# Patient Record
Sex: Male | Born: 1990 | Race: Black or African American | Hispanic: No | Marital: Single | Smoking: Former smoker
Health system: Southern US, Community
[De-identification: ages and names within clinical notes are randomized; demographics above are authoritative.]

---
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
## 2009-05-31 ENCOUNTER — Emergency Department (HOSPITAL_COMMUNITY): Admission: EM | Admit: 2009-05-31 | Discharge: 2009-05-31 | Payer: Self-pay | Admitting: Emergency Medicine

## 2010-05-16 LAB — RAPID STREP SCREEN (MED CTR MEBANE ONLY): Streptococcus, Group A Screen (Direct): NEGATIVE

## 2016-12-28 ENCOUNTER — Emergency Department (HOSPITAL_COMMUNITY)
Admission: EM | Admit: 2016-12-28 | Discharge: 2016-12-28 | Disposition: A | Discharge status: Home / Self Care | Payer: No Typology Code available for payment source | Attending: Emergency Medicine | Admitting: Emergency Medicine

## 2016-12-28 ENCOUNTER — Emergency Department (HOSPITAL_COMMUNITY): Payer: No Typology Code available for payment source

## 2016-12-28 ENCOUNTER — Encounter (HOSPITAL_COMMUNITY): Payer: Self-pay | Admitting: Emergency Medicine

## 2016-12-28 DIAGNOSIS — R51 Headache: Secondary | ICD-10-CM | POA: Insufficient documentation

## 2016-12-28 DIAGNOSIS — Z041 Encounter for examination and observation following transport accident: Secondary | ICD-10-CM | POA: Diagnosis not present

## 2016-12-28 DIAGNOSIS — F17290 Nicotine dependence, other tobacco product, uncomplicated: Secondary | ICD-10-CM | POA: Insufficient documentation

## 2016-12-28 DIAGNOSIS — M546 Pain in thoracic spine: Secondary | ICD-10-CM | POA: Diagnosis not present

## 2016-12-28 DIAGNOSIS — M25561 Pain in right knee: Secondary | ICD-10-CM | POA: Diagnosis not present

## 2016-12-28 MED ORDER — IBUPROFEN 200 MG PO TABS
600.0000 mg | ORAL_TABLET | Freq: Once | ORAL | Status: AC
  Administered 2016-12-28: 600 mg via ORAL
  Filled 2016-12-28: qty 1

## 2016-12-28 MED ORDER — CYCLOBENZAPRINE HCL 10 MG PO TABS: 10.0000 mg | ORAL_TABLET | Freq: Two times a day (BID) | ORAL | 0 refills | Status: DC | PRN

## 2016-12-28 NOTE — Discharge Instructions (Signed)
The x-ray of your right knee and spine do not show fracture.   I have written you a prescription for a muscle relaxer called Flexeril.  Remember that this medicine can make you drowsy, please do not drive, work or drink alcohol while taking this medicine.  You can take 600 mg ibuprofen every 6 hours as needed for knee and back pain.  Return to the ER if you develop a worsening headache with vomiting that will not stop, headache with numbness in which you cannot feel your hands or feet, headache with double vision that does not go away. Also return to the ER if you have any new or worsening symptoms.

## 2016-12-28 NOTE — ED Triage Notes (Signed)
Pt. Has a leg splint to rt. Leg.
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
## 2016-12-28 NOTE — ED Notes (Signed)
Declined W/C at D/C and was escorted to lobby by RN. 
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
## 2017-01-06 ENCOUNTER — Encounter (HOSPITAL_COMMUNITY): Payer: Self-pay | Admitting: Emergency Medicine

## 2017-01-06 ENCOUNTER — Other Ambulatory Visit: Payer: Self-pay

## 2017-01-06 ENCOUNTER — Emergency Department (HOSPITAL_COMMUNITY)
Admission: EM | Admit: 2017-01-06 | Discharge: 2017-01-06 | Disposition: A | Discharge status: Home / Self Care | Payer: No Typology Code available for payment source | Attending: Emergency Medicine | Admitting: Emergency Medicine

## 2017-01-06 DIAGNOSIS — Y998 Other external cause status: Secondary | ICD-10-CM | POA: Insufficient documentation

## 2017-01-06 DIAGNOSIS — Y92410 Unspecified street and highway as the place of occurrence of the external cause: Secondary | ICD-10-CM | POA: Insufficient documentation

## 2017-01-06 DIAGNOSIS — M79651 Pain in right thigh: Secondary | ICD-10-CM

## 2017-01-06 DIAGNOSIS — M545 Low back pain, unspecified: Secondary | ICD-10-CM

## 2017-01-06 DIAGNOSIS — F17290 Nicotine dependence, other tobacco product, uncomplicated: Secondary | ICD-10-CM | POA: Diagnosis not present

## 2017-01-06 DIAGNOSIS — M25561 Pain in right knee: Secondary | ICD-10-CM | POA: Diagnosis not present

## 2017-01-06 DIAGNOSIS — Y9389 Activity, other specified: Secondary | ICD-10-CM | POA: Diagnosis not present

## 2017-01-06 MED ORDER — KETOROLAC TROMETHAMINE 30 MG/ML IJ SOLN
30.0000 mg | Freq: Once | INTRAMUSCULAR | Status: AC
  Administered 2017-01-06: 30 mg via INTRAMUSCULAR
  Filled 2017-01-06: qty 1

## 2017-01-06 MED ORDER — CYCLOBENZAPRINE HCL 10 MG PO TABS: 10.0000 mg | ORAL_TABLET | Freq: Two times a day (BID) | ORAL | 0 refills | Status: DC | PRN

## 2017-01-06 MED ORDER — NAPROXEN 500 MG PO TABS: 500.0000 mg | ORAL_TABLET | Freq: Two times a day (BID) | ORAL | 0 refills | Status: DC
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
## 2017-01-06 NOTE — ED Notes (Signed)
Pt ambulated without assistance with no difficulties.
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
## 2017-01-06 NOTE — Discharge Instructions (Signed)
Naprosyn and Flexeril for pain, please do not come by and with any other pain reliever aside from Tylenol.  These use the next few days to rest, keep knee and leg elevated, may use alternating ice and heat.  Try to avoid vigorous activity or forward bending.  If symptoms are still not improving please follow-up with [facility], please follow-up with your primary care doctor as well.  If you have significantly worsening pain, redness, warmth or swelling of knee or thigh, or you develop weakness, numbness difficulty walking or controlling bowel or bladder please return to the ED for sooner evaluation.

## 2018-03-31 ENCOUNTER — Encounter (HOSPITAL_COMMUNITY): Payer: Self-pay

## 2018-03-31 ENCOUNTER — Ambulatory Visit (HOSPITAL_COMMUNITY)
Admission: EM | Admit: 2018-03-31 | Discharge: 2018-03-31 | Disposition: A | Discharge status: Home / Self Care | Payer: PRIVATE HEALTH INSURANCE | Attending: Family Medicine | Admitting: Family Medicine

## 2018-03-31 DIAGNOSIS — H5712 Ocular pain, left eye: Secondary | ICD-10-CM

## 2018-03-31 MED ORDER — FLUORESCEIN SODIUM 1 MG OP STRP
ORAL_STRIP | OPHTHALMIC | Status: AC
  Filled 2018-03-31: qty 1

## 2018-03-31 MED ORDER — TETRACAINE HCL 0.5 % OP SOLN
OPHTHALMIC | Status: AC
  Filled 2018-03-31: qty 4

## 2018-03-31 NOTE — ED Triage Notes (Signed)
Pt presents with complaints of pain in left eye since this morning. Denies any visual changes.

## 2018-03-31 NOTE — ED Provider Notes (Signed)
[facility]    CSN: [id number] Arrival date & time: 03/31/18  1240     History   Chief Complaint Chief Complaint  Patient presents with  . Eye Pain    HPI Alex Richards is a 28 y.o. male.   HPI patient reports L eye pain upon waking this morning, with pain mostly to the outer aspect of the eye. He denies any known injury or trauma, no drainage or excessive watering and is unaware of any events of foreign bodies entering the eye. Patient reports pain is worse with movement and opening of the eye  , however he denies any changes in his vision. Reports using some otc eye drops at home which caused his eye to feel like it was burning. Denies any other related symptoms such as nasal drainage, cough, sinus pressure or headache.   History reviewed. No pertinent past medical history.  There are no active problems to display for this patient.   History reviewed. No pertinent surgical history.     Home Medications    Prior to Admission medications   Not on File    Family History Family History  Problem Relation Age of Onset  . Diabetes Mother   . Hypertension Mother   . Seizures Mother   . Cancer Mother   . Osteoarthritis Mother   . Healthy Father     Mother - glaucoma and cataracts  Social History Social History   Tobacco Use  . Smoking status: Current Every Day Smoker    Types: E-cigarettes  . Smokeless tobacco: Current User  Substance Use Topics  . Alcohol use: No  . Drug use: No     Allergies   Patient has no known allergies.   Review of Systems Review of Systems  Constitutional: Negative for chills and fever.  HENT: Negative for ear pain and sore throat.   Eyes: Positive for pain and redness. Negative for visual disturbance.  Respiratory: Negative for cough and shortness of breath.   Cardiovascular: Negative for chest pain and palpitations.  Gastrointestinal: Negative for abdominal pain and vomiting.  Genitourinary: Negative for dysuria  and hematuria.  Musculoskeletal: Negative for arthralgias and back pain.  Skin: Negative for color change and rash.  Neurological: Negative for seizures and syncope.  All other systems reviewed and are negative.    Physical Exam Triage Vital Signs ED Triage Vitals [03/31/18 1413]  Enc Vitals Group     BP 119/73     Pulse Rate 62     Resp 18     Temp 98.2 F (36.8 C)     Temp src      SpO2 100 %     Weight      Height      Head Circumference      Peak Flow      Pain Score 6     Pain Loc      Pain Edu?      Excl. in GC?    No data found.  Updated Vital Signs BP 119/73   Pulse 62   Temp 98.2 F (36.8 C)   Resp 18   SpO2 100%       Physical Exam Constitutional:      General: He is in acute distress.     Appearance: He is well-developed.  HENT:     Head: Normocephalic and atraumatic.     Nose: Nose normal.     Mouth/Throat:     Mouth: Mucous membranes are moist.  Eyes:     General: Lids are normal. Lids are everted, no foreign bodies appreciated. Vision grossly intact. Gaze aligned appropriately.        Right eye: No foreign body, discharge or hordeolum.        Left eye: No foreign body or discharge.     Intraocular pressure: Right eye pressure is 21 mmHg.     Extraocular Movements: Extraocular movements intact.     Conjunctiva/sclera:     Right eye: Right conjunctiva is injected.     Pupils: Pupils are equal, round, and reactive to light.     Right eye: Pupil is round and reactive. No fluorescein uptake.  Neck:     Musculoskeletal: Normal range of motion.  Cardiovascular:     Rate and Rhythm: Normal rate.  Pulmonary:     Effort: Pulmonary effort is normal. No respiratory distress.  Abdominal:     General: There is no distension.     Palpations: Abdomen is soft.  Musculoskeletal: Normal range of motion.  Skin:    General: Skin is warm and dry.  Neurological:     Mental Status: He is alert.      UC Treatments / Results  Labs (all labs ordered  are listed, but only abnormal results are displayed) Labs Reviewed - No data to display  EKG None  Radiology No results found.  Procedures Procedures (including critical care time)  Medications Ordered in UC Medications - No data to display  Initial Impression / Assessment and Plan / UC Course  I have reviewed the triage vital signs and the nursing notes.  Pertinent labs & imaging results that were available during my care of the patient were reviewed by me and considered in my medical decision making (see chart for details).    [facility] to see paitent right now  Final Clinical Impressions(s) / UC Diagnoses   Final diagnoses:  Left eye pain     Discharge Instructions     Go to Yates Decamprew Mincey, MD [facility] [address] [facility] [phone]   ED Prescriptions    None     Controlled Substance Prescriptions [place] Controlled Substance Registry consulted? Not Applicable   Eustace MooreNelson, Yvonne Sue, MD 03/31/18 [phone]

## 2018-03-31 NOTE — Discharge Instructions (Signed)
Go to Yates Decamp, MD [facility] [address] [facility] [phone]

## 2018-03-31 NOTE — ED Notes (Signed)
Pt unable to do visual acuity, states it hurts too bad for him to hold his eyes open.

## 2019-01-04 ENCOUNTER — Emergency Department (HOSPITAL_COMMUNITY)
Admission: EM | Admit: 2019-01-04 | Discharge: 2019-01-04 | Disposition: A | Discharge status: Home / Self Care | Payer: Worker's Compensation | Attending: Emergency Medicine | Admitting: Emergency Medicine

## 2019-01-04 ENCOUNTER — Emergency Department (HOSPITAL_COMMUNITY): Payer: Worker's Compensation

## 2019-01-04 ENCOUNTER — Other Ambulatory Visit: Payer: Self-pay

## 2019-01-04 ENCOUNTER — Emergency Department (HOSPITAL_COMMUNITY)
Admission: EM | Admit: 2019-01-04 | Discharge: 2019-01-04 | Discharge status: Against Medical Advice | Payer: PRIVATE HEALTH INSURANCE

## 2019-01-04 ENCOUNTER — Encounter (HOSPITAL_COMMUNITY): Payer: Self-pay

## 2019-01-04 DIAGNOSIS — Y939 Activity, unspecified: Secondary | ICD-10-CM | POA: Insufficient documentation

## 2019-01-04 DIAGNOSIS — S90122A Contusion of left lesser toe(s) without damage to nail, initial encounter: Secondary | ICD-10-CM | POA: Diagnosis not present

## 2019-01-04 DIAGNOSIS — F17290 Nicotine dependence, other tobacco product, uncomplicated: Secondary | ICD-10-CM | POA: Diagnosis not present

## 2019-01-04 DIAGNOSIS — S9032XA Contusion of left foot, initial encounter: Secondary | ICD-10-CM

## 2019-01-04 DIAGNOSIS — Y9289 Other specified places as the place of occurrence of the external cause: Secondary | ICD-10-CM | POA: Insufficient documentation

## 2019-01-04 DIAGNOSIS — W208XXA Other cause of strike by thrown, projected or falling object, initial encounter: Secondary | ICD-10-CM | POA: Diagnosis not present

## 2019-01-04 DIAGNOSIS — Y990 Civilian activity done for income or pay: Secondary | ICD-10-CM | POA: Insufficient documentation

## 2019-01-04 DIAGNOSIS — Z23 Encounter for immunization: Secondary | ICD-10-CM | POA: Insufficient documentation

## 2019-01-04 DIAGNOSIS — S99922A Unspecified injury of left foot, initial encounter: Secondary | ICD-10-CM | POA: Diagnosis present

## 2019-01-04 MED ORDER — IBUPROFEN 200 MG PO TABS
600.0000 mg | ORAL_TABLET | Freq: Once | ORAL | Status: AC
  Administered 2019-01-04: 600 mg via ORAL
  Filled 2019-01-04: qty 1

## 2019-01-04 MED ORDER — IBUPROFEN 600 MG PO TABS: 600.0000 mg | ORAL_TABLET | Freq: Four times a day (QID) | ORAL | 0 refills | Status: DC | PRN

## 2019-01-04 MED ORDER — CEPHALEXIN 500 MG PO CAPS: 500.0000 mg | ORAL_CAPSULE | Freq: Four times a day (QID) | ORAL | 0 refills | Status: DC

## 2019-01-04 MED ORDER — ACETAMINOPHEN 500 MG PO TABS
1000.0000 mg | ORAL_TABLET | Freq: Three times a day (TID) | ORAL | 0 refills | Status: AC | PRN
Start: 2019-01-04 — End: ?

## 2019-01-04 MED ORDER — TETANUS-DIPHTH-ACELL PERTUSSIS 5-2.5-18.5 LF-MCG/0.5 IM SUSP
0.5000 mL | Freq: Once | INTRAMUSCULAR | Status: AC
  Administered 2019-01-04: 0.5 mL via INTRAMUSCULAR
  Filled 2019-01-04: qty 0.5

## 2019-01-04 NOTE — ED Notes (Signed)
Patient given work note for request of light duty or out of work til nov 14th per [place] Peaks PA's verbal order.

## 2019-01-04 NOTE — ED Notes (Signed)
ED Provider at bedside. 

## 2019-01-04 NOTE — ED Provider Notes (Signed)
[facility] EMERGENCY DEPARTMENT Provider Note   CSN: [id number] Arrival date & time: 01/04/19  5916     History   Chief Complaint No chief complaint on file.   HPI Alex Richards is a 28 y.o. male who is previously healthy who presents with left foot injury after something fell on his foot yesterday at work.  He was wearing sneakers.  He reports he washed the toe at home.  He has been able to bear weight.  Patient took Tylenol at home with some relief of the pain.  He denies any other injuries.  Tetanus is not up-to-date     HPI  History reviewed. No pertinent past medical history.  There are no active problems to display for this patient.   History reviewed. No pertinent surgical history.      Home Medications    Prior to Admission medications   Medication Sig Start Date End Date Taking? Authorizing Provider  acetaminophen (TYLENOL) 500 MG tablet Take 2 tablets (1,000 mg total) by mouth every 8 (eight) hours as needed for moderate pain. 01/04/19   Lauriana Denes, Waylan Boga, PA-C  cephALEXin (KEFLEX) 500 MG capsule Take 1 capsule (500 mg total) by mouth 4 (four) times daily. 01/04/19   Oza Oberle, Waylan Boga, PA-C  ibuprofen (ADVIL) 600 MG tablet Take 1 tablet (600 mg total) by mouth every 6 (six) hours as needed. 01/04/19   Emi Holes, PA-C    Family History Family History  Problem Relation Age of Onset   Diabetes Mother    Hypertension Mother    Seizures Mother    Cancer Mother    Osteoarthritis Mother    Healthy Father     Social History Social History   Tobacco Use   Smoking status: Current Every Day Smoker    Types: E-cigarettes   Smokeless tobacco: Current User  Substance Use Topics   Alcohol use: No   Drug use: No     Allergies   Patient has no known allergies.   Review of Systems Review of Systems  Musculoskeletal: Positive for arthralgias.  Skin: Positive for wound.     Physical Exam Updated Vital Signs BP 125/69  (BP Location: Right Arm)    Pulse 79    Temp 98.4 F (36.9 C) (Oral)    Resp 16    SpO2 93%   Physical Exam Vitals signs and nursing note reviewed.  Constitutional:      General: He is not in acute distress.    Appearance: He is well-developed. He is not diaphoretic.  HENT:     Head: Normocephalic and atraumatic.     Mouth/Throat:     Pharynx: No oropharyngeal exudate.  Eyes:     General: No scleral icterus.       Right eye: No discharge.        Left eye: No discharge.     Conjunctiva/sclera: Conjunctivae normal.     Pupils: Pupils are equal, round, and reactive to light.  Neck:     Musculoskeletal: Normal range of motion and neck supple.     Thyroid: No thyromegaly.  Cardiovascular:     Rate and Rhythm: Normal rate and regular rhythm.     Heart sounds: Normal heart sounds. No murmur. No friction rub. No gallop.   Pulmonary:     Effort: Pulmonary effort is normal. No respiratory distress.     Breath sounds: Normal breath sounds. No stridor. No wheezing or rales.  Abdominal:     General:  Bowel sounds are normal. There is no distension.     Palpations: Abdomen is soft.     Tenderness: There is no abdominal tenderness. There is no guarding or rebound.  Musculoskeletal:       Feet:     Comments: No significant edema, deformity, or ecchymosis; however tenderness to the first through third digits of the left foot  Lymphadenopathy:     Cervical: No cervical adenopathy.  Skin:    General: Skin is warm and dry.     Coloration: Skin is not pale.     Findings: No rash.  Neurological:     Mental Status: He is alert.     Coordination: Coordination normal.      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 EXAM: LEFT FOOT - COMPLETE 3+ VIEW  COMPARISON:  None. FINDINGS: There is no evidence of fracture or dislocation. There is no evidence of arthropathy or other focal bone abnormality. Soft tissues are unremarkable. IMPRESSION: Negative. Electronically Signed   By: Marijo Conception M.D.   On: 01/04/2019 10:18    Procedures Procedures (including critical care time)  Medications Ordered in ED Medications  Tdap (BOOSTRIX) injection 0.5 mL (has no administration in time range)  ibuprofen (ADVIL) tablet 600 mg (has no administration in time range)     Initial Impression / Assessment and Plan / ED Course  I have reviewed the triage vital signs and the nursing notes.  Pertinent labs & imaging results that were available during my care of the patient were reviewed by me and considered in my medical decision making (see chart for details).        Patient with foot contusion and very superficial wound to the left great toe.  No indication for repair.  Considering location, will cover with Keflex for prophylaxis.  X-rays negative.  Tetanus updated.  Ibuprofen and Tylenol discussed for pain.  Postop shoe given for comfort.  Will refer to podiatry as needed if symptoms not improving.  Patient made no wear of occult fracture.  X-ray reviewed by me as well.  Patient understands and agrees with plan.  Patient vital stable throughout ED course and discharged in satisfactory condition.  Final Clinical Impressions(s) / ED Diagnoses   Final diagnoses:  Contusion of left foot including toes, initial encounter    ED Discharge Orders         Ordered    cephALEXin (KEFLEX) 500 MG capsule  4 times daily     01/04/19 1044    ibuprofen (ADVIL) 600 MG tablet  Every 6 hours PRN     01/04/19 1044    acetaminophen (TYLENOL) 500 MG tablet  Every 8 hours PRN     01/04/19 1044           Nonna Renninger, [place], PA-C 01/04/19 1045    Virgel Manifold, MD 01/04/19 1113

## 2019-01-04 NOTE — ED Triage Notes (Signed)
Patient complains of pain to left great toe after hitting same with forklift last night, arrived with dressing to same and bleeding controlled

## 2019-01-04 NOTE — ED Notes (Signed)
Patient verbalized understanding of dc instructions, vss, ambulatory with nad.   

## 2019-01-04 NOTE — ED Notes (Signed)
Pt did not respond when called for triage 

## 2019-01-04 NOTE — Discharge Instructions (Signed)
Take Keflex as prescribed to help prevent infection.  Apply antibiotic ointment to your wound daily after washing with warm soapy water.  Take ibuprofen and Tylenol as prescribed, as needed for your pain.  Use ice 3-4 times daily alternating 20 minutes on, 20 minutes off.  Elevate your foot whenever you are not walking on it.  Use postop shoe for comfort.  You can follow-up with Dr. Amalia Hailey, podiatrist, if symptoms are not improving over the next week.  Please return the emergency department if you develop increasing pain, redness, swelling, drainage, red streaking from the wound.

## 2020-03-06 ENCOUNTER — Ambulatory Visit
Admission: EM | Admit: 2020-03-06 | Discharge: 2020-03-06 | Disposition: A | Discharge status: Home / Self Care | Payer: Commercial Managed Care - PPO | Attending: Internal Medicine | Admitting: Internal Medicine

## 2020-03-06 ENCOUNTER — Encounter: Payer: Self-pay | Admitting: Emergency Medicine

## 2020-03-06 ENCOUNTER — Other Ambulatory Visit: Payer: Self-pay

## 2020-03-06 DIAGNOSIS — R112 Nausea with vomiting, unspecified: Secondary | ICD-10-CM

## 2020-03-06 DIAGNOSIS — R509 Fever, unspecified: Secondary | ICD-10-CM

## 2020-03-06 DIAGNOSIS — M791 Myalgia, unspecified site: Secondary | ICD-10-CM | POA: Diagnosis not present

## 2020-03-06 DIAGNOSIS — R6889 Other general symptoms and signs: Secondary | ICD-10-CM | POA: Diagnosis not present

## 2020-03-06 MED ORDER — ONDANSETRON 4 MG PO TBDP: 4.0000 mg | ORAL_TABLET | Freq: Three times a day (TID) | ORAL | 0 refills | Status: DC | PRN

## 2020-03-06 MED ORDER — IBUPROFEN 600 MG PO TABS: 600.0000 mg | ORAL_TABLET | Freq: Four times a day (QID) | ORAL | 0 refills | Status: AC | PRN

## 2020-03-06 NOTE — Discharge Instructions (Signed)
Increase oral fluid intake Please quarantine until COVID-19 test results are available Ibuprofen as needed for fever and/for generalized body aches We will call you with recommendations if labs are abnormal Return to urgent care if symptoms worsen. 

## 2020-03-06 NOTE — ED Provider Notes (Signed)
[facility]    CSN: [id number] Arrival date & time: 03/06/20  0810      History   Chief Complaint Chief Complaint  Patient presents with  . Emesis  . Headache    HPI Alex Richards is a 30 y.o. male comes to urgent care with a 1 day history of throbbing headache, vomiting and some abdominal discomfort.  Patient has subjective fever and chills.  No diarrhea.  No recent travel or unusual diet.  Patient is vaccinated against COVID-19 virus.  He is going to get his booster shot next week.  No dizziness, near syncope or syncopal episode.  No sick contacts.  Patient has had 4 episodes of nonbloody nonbilious vomiting.   HPI  History reviewed. No pertinent past medical history.  There are no problems to display for this patient.   History reviewed. No pertinent surgical history.     Home Medications    Prior to Admission medications   Medication Sig Start Date End Date Taking? Authorizing Provider  ibuprofen (ADVIL) 600 MG tablet Take 1 tablet (600 mg total) by mouth every 6 (six) hours as needed. 03/06/20  Yes Romie Tay, Britta Mccreedy, MD  ondansetron (ZOFRAN ODT) 4 MG disintegrating tablet Take 1 tablet (4 mg total) by mouth every 8 (eight) hours as needed for nausea or vomiting. 03/06/20  Yes Rachit Grim, Britta Mccreedy, MD  acetaminophen (TYLENOL) 500 MG tablet Take 2 tablets (1,000 mg total) by mouth every 8 (eight) hours as needed for moderate pain. 01/04/19   Law, Waylan Boga, PA-C  cephALEXin (KEFLEX) 500 MG capsule Take 1 capsule (500 mg total) by mouth 4 (four) times daily. 01/04/19   Emi Holes, PA-C    Family History Family History  Problem Relation Age of Onset  . Diabetes Mother   . Hypertension Mother   . Seizures Mother   . Cancer Mother   . Osteoarthritis Mother   . Healthy Father     Social History Social History   Tobacco Use  . Smoking status: Current Every Day Smoker    Types: E-cigarettes  . Smokeless tobacco: Current User  Substance Use  Topics  . Alcohol use: No  . Drug use: No     Allergies   Patient has no known allergies.   Review of Systems Review of Systems  Constitutional: Positive for chills and fever.  HENT: Negative.  Negative for sore throat.   Respiratory: Negative for cough.   Gastrointestinal: Positive for abdominal pain, nausea and vomiting. Negative for diarrhea.  Musculoskeletal: Positive for myalgias. Negative for joint swelling.  Neurological: Positive for dizziness.     Physical Exam Triage Vital Signs ED Triage Vitals  Enc Vitals Group     BP 03/06/20 0822 (!) 144/86     Pulse Rate 03/06/20 0822 73     Resp 03/06/20 0822 18     Temp 03/06/20 0822 98.4 F (36.9 C)     Temp Source 03/06/20 0822 Oral     SpO2 03/06/20 0822 97 %     Weight --      Height --      Head Circumference --      Peak Flow --      Pain Score 03/06/20 0834 4     Pain Loc --      Pain Edu? --      Excl. in GC? --    No data found.  Updated Vital Signs BP (!) 144/86   Pulse 73   Temp  98.4 F (36.9 C) (Oral)   Resp 18   SpO2 97%   Visual Acuity Right Eye Distance:   Left Eye Distance:   Bilateral Distance:    Right Eye Near:   Left Eye Near:    Bilateral Near:     Physical Exam Vitals and nursing note reviewed.  Constitutional:      General: He is not in acute distress.    Appearance: He is not ill-appearing.  Cardiovascular:     Rate and Rhythm: Normal rate and regular rhythm.     Heart sounds: Normal heart sounds.  Pulmonary:     Effort: Pulmonary effort is normal.     Breath sounds: Normal breath sounds.  Abdominal:     General: Bowel sounds are normal.     Palpations: Abdomen is soft.  Neurological:     Mental Status: He is alert.     GCS: GCS eye subscore is 4. GCS verbal subscore is 5. GCS motor subscore is 6.      UC Treatments / Results  Labs (all labs ordered are listed, but only abnormal results are displayed) Labs Reviewed  COVID-19, FLU A+B NAA     EKG   Radiology No results found.  Procedures Procedures (including critical care time)  Medications Ordered in UC Medications - No data to display  Initial Impression / Assessment and Plan / UC Course  I have reviewed the triage vital signs and the nursing notes.  Pertinent labs & imaging results that were available during my care of the patient were reviewed by me and considered in my medical decision making (see chart for details).     1.  Flulike symptoms: Zofran as needed for nausea/vomiting Ibuprofen 600 mg every 6 hours as needed for generalized body aches and/or fever Increase oral fluid intake COVID-19, flu a plus B PCR test sent Please quarantine until COVID-19 test results are available Return to urgent care if your symptoms worsen. Final Clinical Impressions(s) / UC Diagnoses   Final diagnoses:  Flu-like symptoms     Discharge Instructions     Increase oral fluid intake Please quarantine until COVID-19 test results are available Ibuprofen as needed for fever and/for generalized body aches We will call you with recommendations if labs are abnormal Return to urgent care if symptoms worsen.   ED Prescriptions    Medication Sig Dispense Auth. Provider   ondansetron (ZOFRAN ODT) 4 MG disintegrating tablet Take 1 tablet (4 mg total) by mouth every 8 (eight) hours as needed for nausea or vomiting. 20 tablet Jarett Dralle, Britta Mccreedy, MD   ibuprofen (ADVIL) 600 MG tablet Take 1 tablet (600 mg total) by mouth every 6 (six) hours as needed. 30 tablet Jakarius Flamenco, Britta Mccreedy, MD     PDMP not reviewed this encounter.   Merrilee Jansky, MD 03/06/20 0900

## 2020-03-06 NOTE — ED Triage Notes (Signed)
Pt said headache, vomiting this am and some abdominal discomfort. Pt said he has hot flashes but is cold.

## 2020-03-08 LAB — COVID-19, FLU A+B NAA
Influenza A, NAA: NOT DETECTED
Influenza B, NAA: NOT DETECTED
SARS-CoV-2, NAA: NOT DETECTED

## 2020-07-17 ENCOUNTER — Other Ambulatory Visit: Payer: Self-pay

## 2020-07-17 ENCOUNTER — Emergency Department (HOSPITAL_COMMUNITY)
Admission: EM | Admit: 2020-07-17 | Discharge: 2020-07-17 | Disposition: A | Discharge status: Home / Self Care | Payer: Commercial Managed Care - PPO | Attending: Emergency Medicine | Admitting: Emergency Medicine

## 2020-07-17 ENCOUNTER — Encounter (HOSPITAL_COMMUNITY): Payer: Self-pay

## 2020-07-17 DIAGNOSIS — Z20822 Contact with and (suspected) exposure to covid-19: Secondary | ICD-10-CM | POA: Insufficient documentation

## 2020-07-17 DIAGNOSIS — R110 Nausea: Secondary | ICD-10-CM | POA: Insufficient documentation

## 2020-07-17 DIAGNOSIS — R6889 Other general symptoms and signs: Secondary | ICD-10-CM

## 2020-07-17 DIAGNOSIS — R509 Fever, unspecified: Secondary | ICD-10-CM | POA: Insufficient documentation

## 2020-07-17 DIAGNOSIS — F17290 Nicotine dependence, other tobacco product, uncomplicated: Secondary | ICD-10-CM | POA: Diagnosis not present

## 2020-07-17 DIAGNOSIS — R519 Headache, unspecified: Secondary | ICD-10-CM | POA: Diagnosis not present

## 2020-07-17 LAB — RESP PANEL BY RT-PCR (FLU A&B, COVID) ARPGX2
Influenza A by PCR: NEGATIVE
Influenza B by PCR: NEGATIVE
SARS Coronavirus 2 by RT PCR: NEGATIVE

## 2020-07-17 MED ORDER — IBUPROFEN 800 MG PO TABS
800.0000 mg | ORAL_TABLET | Freq: Once | ORAL | Status: AC
  Administered 2020-07-17: 800 mg via ORAL
  Filled 2020-07-17: qty 1

## 2020-07-17 MED ORDER — ONDANSETRON 8 MG PO TBDP: 4.0000 mg | ORAL_TABLET | Freq: Three times a day (TID) | ORAL | 0 refills | Status: AC | PRN

## 2020-07-17 MED ORDER — ONDANSETRON 4 MG PO TBDP
4.0000 mg | ORAL_TABLET | Freq: Once | ORAL | Status: AC
  Administered 2020-07-17: 4 mg via ORAL
  Filled 2020-07-17: qty 1

## 2020-07-17 MED ORDER — METOCLOPRAMIDE HCL 10 MG PO TABS
5.0000 mg | ORAL_TABLET | Freq: Once | ORAL | Status: AC
  Administered 2020-07-17: 5 mg via ORAL
  Filled 2020-07-17: qty 1

## 2020-07-17 NOTE — ED Notes (Signed)
Patient discharge instructions reviewed with the patient. The patient verbalized understanding of instructions. Patient discharged. 

## 2020-07-17 NOTE — ED Provider Notes (Signed)
[facility] EMERGENCY DEPARTMENT Provider Note   CSN: [id number] Arrival date & time: 07/17/20  2122     History Chief Complaint  Patient presents with  . Fever  . Nausea  . Headache    Alex Richards is a 30 y.o. male who presents with onset of Flu like sxs. Patient awoke with fever at home, HA, and nausea. He was in his normal state of health yesterday. He denies cough, sob, diarrhea, abdominal pain, vomiting, rash, tick bites (no outdoor work, hiking, or other known possible exposures). He has no recent foreign travel.  HPI     History reviewed. No pertinent past medical history.  There are no problems to display for this patient.   History reviewed. No pertinent surgical history.     Family History  Problem Relation Age of Onset  . Diabetes Mother   . Hypertension Mother   . Seizures Mother   . Cancer Mother   . Osteoarthritis Mother   . Healthy Father     Social History   Tobacco Use  . Smoking status: Current Every Day Smoker    Types: E-cigarettes  . Smokeless tobacco: Current User  Substance Use Topics  . Alcohol use: No  . Drug use: No    Home Medications Prior to Admission medications   Medication Sig Start Date End Date Taking? Authorizing Provider  acetaminophen (TYLENOL) 500 MG tablet Take 2 tablets (1,000 mg total) by mouth every 8 (eight) hours as needed for moderate pain. 01/04/19   Law, Waylan Boga, PA-C  cephALEXin (KEFLEX) 500 MG capsule Take 1 capsule (500 mg total) by mouth 4 (four) times daily. 01/04/19   Law, Waylan Boga, PA-C  ibuprofen (ADVIL) 600 MG tablet Take 1 tablet (600 mg total) by mouth every 6 (six) hours as needed. 03/06/20   Lamptey, Britta Mccreedy, MD  ondansetron (ZOFRAN ODT) 4 MG disintegrating tablet Take 1 tablet (4 mg total) by mouth every 8 (eight) hours as needed for nausea or vomiting. 03/06/20   Lamptey, Britta Mccreedy, MD    Allergies    Patient has no known allergies.  Review of Systems   Review of  Systems Ten systems reviewed and are negative for acute change, except as noted in the HPI.   Physical Exam Updated Vital Signs BP (!) 142/83   Pulse 77   Temp 98.6 F (37 C) (Oral)   Resp 16   Ht 5\' 10"  (1.778 m)   Wt 122.5 kg   SpO2 100%   BMI 38.75 kg/m   Physical Exam Vitals and nursing note reviewed.  Constitutional:      General: He is not in acute distress.    Appearance: He is well-developed. He is ill-appearing. He is not toxic-appearing or diaphoretic.  HENT:     Head: Normocephalic and atraumatic.  Eyes:     General: No scleral icterus.    Conjunctiva/sclera: Conjunctivae normal.     Pupils: Pupils are equal, round, and reactive to light.     Comments: No horizontal, vertical or rotational nystagmus  Neck:     Comments: Full active and passive ROM without pain No midline or paraspinal tenderness No nuchal rigidity or meningeal signs Cardiovascular:     Rate and Rhythm: Normal rate and regular rhythm.     Heart sounds: Normal heart sounds.  Pulmonary:     Effort: Pulmonary effort is normal. No respiratory distress.     Breath sounds: Normal breath sounds. No wheezing or rales.  Abdominal:  General: Bowel sounds are normal.     Palpations: Abdomen is soft.     Tenderness: There is no abdominal tenderness. There is no guarding or rebound.  Musculoskeletal:        General: Normal range of motion.     Cervical back: Normal range of motion and neck supple.  Lymphadenopathy:     Cervical: No cervical adenopathy.  Skin:    General: Skin is warm and dry.     Findings: No rash.  Neurological:     Mental Status: He is alert and oriented to person, place, and time.     Cranial Nerves: No cranial nerve deficit.     Motor: No abnormal muscle tone.     Coordination: Coordination normal.     Deep Tendon Reflexes: Reflexes are normal and symmetric.     Comments: Mental Status:  Alert, oriented, thought content appropriate. Speech fluent without evidence of  aphasia. Able to follow 2 step commands without difficulty.  Cranial Nerves:  II:  Peripheral visual fields grossly normal, pupils equal, round, reactive to light III,IV, VI: ptosis not present, extra-ocular motions intact bilaterally  V,VII: smile symmetric, facial light touch sensation equal VIII: hearing grossly normal bilaterally  IX,X: midline uvula rise  XI: bilateral shoulder shrug equal and strong XII: midline tongue extension  Motor:  5/5 in upper and lower extremities bilaterally including strong and equal grip strength and dorsiflexion/plantar flexion Sensory: Pinprick and light touch normal in all extremities.  Deep Tendon Reflexes: 2+ and symmetric  Cerebellar: normal finger-to-nose with bilateral upper extremities Gait: normal gait and balance CV: distal pulses palpable throughout   Psychiatric:        Behavior: Behavior normal.        Thought Content: Thought content normal.        Judgment: Judgment normal.     ED Results / Procedures / Treatments   Labs (all labs ordered are listed, but only abnormal results are displayed) Labs Reviewed  RESP PANEL BY RT-PCR (FLU A&B, COVID) [id number]    EKG None  Radiology No results found.  Procedures Procedures   Medications Ordered in ED Medications  ondansetron (ZOFRAN-ODT) disintegrating tablet 4 mg (has no administration in time range)  ibuprofen (ADVIL) tablet 800 mg (has no administration in time range)    ED Course  I have reviewed the triage vital signs and the nursing notes.  Pertinent labs & imaging results that were available during my care of the patient were reviewed by me and considered in my medical decision making (see chart for details).    MDM Rules/Calculators/A&P                          Patient with Flu like sxs. Pt HA treated and improved while in ED.I have no concern for [facility], [facility], Meningitis. Pt has no focal neuro deficits, nuchal rigidity, or change in vision. He is negative for COVID  and Influenza. No known tick bites. D/c with strict return precautions and symptomatic treatment. Final Clinical Impression(s) / ED Diagnoses Final diagnoses:  None    Rx / DC Orders ED Discharge Orders    None       Arthor Captain, PA-C 07/17/20 0175    Sabino Donovan, MD 07/17/20 1530

## 2020-07-17 NOTE — ED Triage Notes (Signed)
Woke up today with headache, fever and nausea.   Took tylenol and ibuprofen at home. Vaccinated.

## 2020-07-17 NOTE — ED Provider Notes (Signed)
Emergency Medicine Provider Triage Evaluation Note  Alex Richards , a 30 y.o. male  was evaluated in triage.  Pt complains of fever, nausea, and headaches.  Symptoms onset was this morning.  Denies cough.  Denies known sick contacts.  Vaccinated against covid.  Has taken Tylenol and motrin.  Review of Systems  Positive: Fever, nausea, headache Negative: Cough  Physical Exam  BP (!) 142/83   Pulse 77   Temp 98.6 F (37 C) (Oral)   Resp 16   Ht 5\' 10"  (1.778 m)   Wt 122.5 kg   SpO2 100%   BMI 38.75 kg/m  Gen:   Awake, no distress   Resp:  Normal effort  MSK:   Moves extremities without difficulty  Other:  No meningismus   Medical Decision Making  Medically screening exam initiated at 6:28 AM.  Appropriate orders placed.  Alex Richards was informed that the remainder of the evaluation will be completed by another provider, this initial triage assessment does not replace that evaluation, and the importance of remaining in the ED until their evaluation is complete.     Alex Claude, PA-C 07/17/20 07/19/20    5916, MD 07/17/20 [phone]

## 2020-07-17 NOTE — Discharge Instructions (Signed)
Contact a health care provider if: You have symptoms of a viral illness that do not go away. Your symptoms come back after going away. Your symptoms get worse. Get help right away if you have: Trouble breathing. A severe headache or a stiff neck. Severe vomiting or pain in your abdomen.

## 2020-09-04 ENCOUNTER — Emergency Department (HOSPITAL_COMMUNITY)
Admission: EM | Admit: 2020-09-04 | Discharge: 2020-09-04 | Disposition: A | Discharge status: Home / Self Care | Payer: Commercial Managed Care - PPO | Attending: Emergency Medicine | Admitting: Emergency Medicine

## 2020-09-04 ENCOUNTER — Emergency Department (HOSPITAL_COMMUNITY): Payer: Commercial Managed Care - PPO

## 2020-09-04 ENCOUNTER — Other Ambulatory Visit: Payer: Self-pay

## 2020-09-04 DIAGNOSIS — F17290 Nicotine dependence, other tobacco product, uncomplicated: Secondary | ICD-10-CM | POA: Diagnosis not present

## 2020-09-04 DIAGNOSIS — K2100 Gastro-esophageal reflux disease with esophagitis, without bleeding: Secondary | ICD-10-CM

## 2020-09-04 DIAGNOSIS — R072 Precordial pain: Secondary | ICD-10-CM

## 2020-09-04 LAB — CBC
HCT: 44.1 % (ref 39.0–52.0)
Hemoglobin: 14.3 g/dL (ref 13.0–17.0)
MCH: 28.2 pg (ref 26.0–34.0)
MCHC: 32.4 g/dL (ref 30.0–36.0)
MCV: 87 fL (ref 80.0–100.0)
Platelets: 282 10*3/uL (ref 150–400)
RBC: 5.07 MIL/uL (ref 4.22–5.81)
RDW: 13.5 % (ref 11.5–15.5)
WBC: 6.5 10*3/uL (ref 4.0–10.5)
nRBC: 0 % (ref 0.0–0.2)

## 2020-09-04 LAB — BASIC METABOLIC PANEL
Anion gap: 7 (ref 5–15)
BUN: 16 mg/dL (ref 6–20)
CO2: 25 mmol/L (ref 22–32)
Calcium: 9 mg/dL (ref 8.9–10.3)
Chloride: 106 mmol/L (ref 98–111)
Creatinine, Ser: 0.91 mg/dL (ref 0.61–1.24)
GFR, Estimated: >60 mL/min (ref >60)
Glucose, Bld: 127 mg/dL — ABNORMAL HIGH (ref 70–99)
Potassium: 4 mmol/L (ref 3.5–5.1)
Sodium: 138 mmol/L (ref 135–145)

## 2020-09-04 LAB — TROPONIN I (HIGH SENSITIVITY): Troponin I (High Sensitivity): 5 ng/L (ref <18)

## 2020-09-04 MED ORDER — ALUM & MAG HYDROXIDE-SIMETH 200-200-20 MG/5ML PO SUSP
30.0000 mL | Freq: Once | ORAL | Status: AC
  Administered 2020-09-04: 30 mL via ORAL
  Filled 2020-09-04: qty 30

## 2020-09-04 MED ORDER — FAMOTIDINE 20 MG PO TABS
20.0000 mg | ORAL_TABLET | Freq: Once | ORAL | Status: AC
  Administered 2020-09-04: 20 mg via ORAL
  Filled 2020-09-04: qty 1

## 2020-09-04 MED ORDER — PANTOPRAZOLE SODIUM 40 MG PO TBEC: 40.0000 mg | DELAYED_RELEASE_TABLET | Freq: Every day | ORAL | 0 refills | Status: AC

## 2020-09-04 MED ORDER — ACETAMINOPHEN 500 MG PO TABS
1000.0000 mg | ORAL_TABLET | Freq: Once | ORAL | Status: AC
  Administered 2020-09-04: 1000 mg via ORAL
  Filled 2020-09-04: qty 2

## 2020-09-04 NOTE — Discharge Instructions (Addendum)
It was our pleasure to provide your ER care today - we hope that you feel better.  Overall, your ER tests look good/normal.   Take protonix (acid blocker medication). You may also try pepcid or maalox for symptom relief.   Follow up with primary care doctor in the next 1-2 weeks - also have your blood pressure rechecked then, as it is mildly high today.  Return to ER if worse, new symptoms, fevers, trouble breathing, recurrent/persistent chest pain, fainting, or other emergency concern.

## 2020-09-04 NOTE — ED Triage Notes (Signed)
Pt c/o intermittent burning central chest pain along with nausea and dizziness. Onset on Thursday worsening today. Onset of pain rates 8/10 currently at rest rates pain 5/10. Denies cardiac hx.

## 2020-09-04 NOTE — ED Provider Notes (Signed)
[facility] EMERGENCY DEPARTMENT Provider Note   CSN: [id number] Arrival date & time: 09/04/20  1324     History Chief Complaint  Patient presents with   Chest Pain    Alex Richards is a 30 y.o. male.  Patient c/o midline, lower sternal/xiphoid area pain for past 3-4 days. Symptoms occur at rest, constant, dull/burning, non radiating, not pleuritic. No associated with activity or exertion. No change whether upright or supine. Denies any exertional cp or unusual doe. No associated sob, nv or diaphoresis. No personal hx cad. No fam hx premature cad. No chest wall injury or strain. No hx gerd. Denies leg pain or swelling. No cough or uri symptoms. No fever or chills.   The history is provided by the patient.  Chest Pain Associated symptoms: no abdominal pain, no back pain, no cough, no fever, no headache, no shortness of breath and no vomiting       No past medical history on file.  There are no problems to display for this patient.   No past surgical history on file.     Family History  Problem Relation Age of Onset   Diabetes Mother    Hypertension Mother    Seizures Mother    Cancer Mother    Osteoarthritis Mother    Healthy Father     Social History   Tobacco Use   Smoking status: Every Day    Pack years: 0.00    Types: E-cigarettes   Smokeless tobacco: Current  Substance Use Topics   Alcohol use: No   Drug use: No    Home Medications Prior to Admission medications   Medication Sig Start Date End Date Taking? Authorizing Provider  acetaminophen (TYLENOL) 500 MG tablet Take 2 tablets (1,000 mg total) by mouth every 8 (eight) hours as needed for moderate pain. 01/04/19   Law, Waylan Boga, PA-C  ibuprofen (ADVIL) 600 MG tablet Take 1 tablet (600 mg total) by mouth every 6 (six) hours as needed. 03/06/20   Merrilee Jansky, MD  ondansetron (ZOFRAN ODT) 8 MG disintegrating tablet Take 0.5-1 tablets (4-8 mg total) by mouth every 8 (eight)  hours as needed for nausea or vomiting. 07/17/20   Arthor Captain, PA-C    Allergies    Patient has no known allergies.  Review of Systems   Review of Systems  Constitutional:  Negative for chills and fever.  HENT:  Negative for sore throat.   Eyes:  Negative for redness.  Respiratory:  Negative for cough and shortness of breath.   Cardiovascular:  Positive for chest pain. Negative for leg swelling.  Gastrointestinal:  Negative for abdominal pain and vomiting.  Genitourinary:  Negative for flank pain.  Musculoskeletal:  Negative for back pain and neck pain.  Skin:  Negative for rash.  Neurological:  Negative for headaches.  Hematological:  Does not bruise/bleed easily.  Psychiatric/Behavioral:  Negative for confusion.    Physical Exam Updated Vital Signs BP (!) 138/95   Pulse (!) 49   Temp 98.2 F (36.8 C) (Oral)   Resp 16   Ht 1.778 m (5\' 10" )   Wt (!) 144.2 kg   SpO2 99%   BMI 45.63 kg/m   Physical Exam Vitals and nursing note reviewed.  Constitutional:      Appearance: Normal appearance. He is well-developed.  HENT:     Head: Atraumatic.     Nose: Nose normal.     Mouth/Throat:     Mouth: Mucous membranes are moist.  Pharynx: Oropharynx is clear.  Eyes:     General: No scleral icterus.    Conjunctiva/sclera: Conjunctivae normal.  Neck:     Trachea: No tracheal deviation.  Cardiovascular:     Rate and Rhythm: Normal rate and regular rhythm.     Pulses: Normal pulses.     Heart sounds: Normal heart sounds. No murmur heard.   No friction rub. No gallop.  Pulmonary:     Effort: Pulmonary effort is normal. No accessory muscle usage or respiratory distress.     Breath sounds: Normal breath sounds.  Abdominal:     General: Bowel sounds are normal. There is no distension.     Palpations: Abdomen is soft.     Tenderness: There is no abdominal tenderness. There is no guarding.  Genitourinary:    Comments: No cva tenderness. Musculoskeletal:        General:  No swelling or tenderness.     Cervical back: Normal range of motion and neck supple. No rigidity.     Right lower leg: No edema.     Left lower leg: No edema.  Skin:    General: Skin is warm and dry.     Findings: No rash.  Neurological:     Mental Status: He is alert.     Comments: Alert, speech clear.   Psychiatric:        Mood and Affect: Mood normal.    ED Results / Procedures / Treatments   Labs (all labs ordered are listed, but only abnormal results are displayed) Results for orders placed or performed during the hospital encounter of 09/04/20  Basic metabolic panel  Result Value Ref Range   Sodium 138 135 - 145 mmol/L   Potassium 4.0 3.5 - 5.1 mmol/L   Chloride 106 98 - 111 mmol/L   CO2 25 22 - 32 mmol/L   Glucose, Bld 127 (H) 70 - 99 mg/dL   BUN 16 6 - 20 mg/dL   Creatinine, Ser 6.46 0.61 - 1.24 mg/dL   Calcium 9.0 8.9 - 80.3 mg/dL   GFR, Estimated >21 >22 mL/min   Anion gap 7 5 - 15  CBC  Result Value Ref Range   WBC 6.5 4.0 - 10.5 K/uL   RBC 5.07 4.22 - 5.81 MIL/uL   Hemoglobin 14.3 13.0 - 17.0 g/dL   HCT 48.2 50.0 - 37.0 %   MCV 87.0 80.0 - 100.0 fL   MCH 28.2 26.0 - 34.0 pg   MCHC 32.4 30.0 - 36.0 g/dL   RDW 48.8 89.1 - 69.4 %   Platelets 282 150 - 400 K/uL   nRBC 0.0 0.0 - 0.2 %  Troponin I (High Sensitivity)  Result Value Ref Range   Troponin I (High Sensitivity) 5 <18 ng/L   DG Chest 2 View  Result Date: 09/04/2020 CLINICAL DATA:  Burning sensation in the chest with nausea for 5 days EXAM: CHEST - 2 VIEW COMPARISON:  None. FINDINGS: The heart size and mediastinal contours are within normal limits. Both lungs are clear. The visualized skeletal structures are unremarkable. IMPRESSION: No active cardiopulmonary disease. Electronically Signed   By: Marnee Spring M.D.   On: 09/04/2020 07:23    EKG EKG Interpretation  Date/Time:  Monday September 04 2020 07:04:22 EDT Ventricular Rate:  56 PR Interval:  136 QRS Duration: 80 QT Interval:  412 QTC  Calculation: 397 R Axis:   9 Text Interpretation: Sinus bradycardia with sinus arrhythmia No previous tracing Confirmed by Cathren Laine ([id number]) on  09/04/2020 9:50:27 AM  Radiology DG Chest 2 View  Result Date: 09/04/2020 CLINICAL DATA:  Burning sensation in the chest with nausea for 5 days EXAM: CHEST - 2 VIEW COMPARISON:  None. FINDINGS: The heart size and mediastinal contours are within normal limits. Both lungs are clear. The visualized skeletal structures are unremarkable. IMPRESSION: No active cardiopulmonary disease. Electronically Signed   By: Marnee Spring M.D.   On: 09/04/2020 07:23    Procedures Procedures   Medications Ordered in ED Medications  alum & mag hydroxide-simeth (MAALOX/MYLANTA) 200-200-20 MG/5ML suspension 30 mL (has no administration in time range)  famotidine (PEPCID) tablet 20 mg (has no administration in time range)  acetaminophen (TYLENOL) tablet 1,000 mg (has no administration in time range)    ED Course  I have reviewed the triage vital signs and the nursing notes.  Pertinent labs & imaging results that were available during my care of the patient were reviewed by me and considered in my medical decision making (see chart for details).    MDM Rules/Calculators/A&P                         Iv ns. Continuous pulse ox and cardiac monitoring. Stat labs. Imaging. Ecg.   Reviewed nursing notes and prior charts for additional history.   Labs reviewed/interpreted by me - after continual symptoms for past few days, trop is normal - felt not c/w ACS.  CXR reviewed/interpreted by me - no pna.   Pepcid, maalox and acetaminophen given for symptom relief.   Pt currently appears stable for d/c.   Rec pcp f/u.  Return precautions provided.     Final Clinical Impression(s) / ED Diagnoses Final diagnoses:  None    Rx / DC Orders ED Discharge Orders     None        Cathren Laine, MD 09/04/20 1015

## 2021-06-06 ENCOUNTER — Ambulatory Visit
Admission: EM | Admit: 2021-06-06 | Discharge: 2021-06-06 | Disposition: A | Discharge status: Home / Self Care | Payer: Commercial Managed Care - PPO | Attending: Internal Medicine | Admitting: Internal Medicine

## 2021-06-06 DIAGNOSIS — L03011 Cellulitis of right finger: Secondary | ICD-10-CM

## 2021-06-06 MED ORDER — CEPHALEXIN 500 MG PO CAPS: 500.0000 mg | ORAL_CAPSULE | Freq: Four times a day (QID) | ORAL | 0 refills | Status: AC

## 2021-06-06 NOTE — Discharge Instructions (Signed)
You have an infection of your right thumb which is being treated with antibiotics.  Please also use warm Epsom salt soaks.  Follow-up if symptoms persist or worsen. ?

## 2021-06-06 NOTE — ED Provider Notes (Signed)
?[facility] ? ? ? ?CSN: [id number] ?Arrival date & time: 06/06/21  1536 ? ? ?  ? ?History   ?Chief Complaint ?Chief Complaint  ?Patient presents with  ? finger abscess  ? ? ?HPI ?Alex Richards is a 31 y.o. male.  ? ?Patient presents with swelling, redness, pain to right thumb that started a few days prior.  Denies any injury to the area.  Denies numbness or tingling.  Patient has full range of motion.  Denies fevers, body aches, chills. ? ? ? ?History reviewed. No pertinent past medical history. ? ?There are no problems to display for this patient. ? ? ?History reviewed. No pertinent surgical history. ? ? ? ? ?Home Medications   ? ?Prior to Admission medications   ?Medication Sig Start Date End Date Taking? Authorizing Provider  ?cephALEXin (KEFLEX) 500 MG capsule Take 1 capsule (500 mg total) by mouth 4 (four) times daily. 06/06/21  Yes Gustavus Bryant, FNP  ?acetaminophen (TYLENOL) 500 MG tablet Take 2 tablets (1,000 mg total) by mouth every 8 (eight) hours as needed for moderate pain. 01/04/19   Emi Holes, PA-C  ?ibuprofen (ADVIL) 600 MG tablet Take 1 tablet (600 mg total) by mouth every 6 (six) hours as needed. 03/06/20   Merrilee Jansky, MD  ?ondansetron (ZOFRAN ODT) 8 MG disintegrating tablet Take 0.5-1 tablets (4-8 mg total) by mouth every 8 (eight) hours as needed for nausea or vomiting. 07/17/20   Arthor Captain, PA-C  ?pantoprazole (PROTONIX) 40 MG tablet Take 1 tablet (40 mg total) by mouth daily. 09/04/20   Cathren Laine, MD  ? ? ?Family History ?Family History  ?Problem Relation Age of Onset  ? Diabetes Mother   ? Hypertension Mother   ? Seizures Mother   ? Cancer Mother   ? Osteoarthritis Mother   ? Healthy Father   ? ? ?Social History ?Social History  ? ?Tobacco Use  ? Smoking status: Every Day  ?  Types: E-cigarettes  ? Smokeless tobacco: Current  ?Substance Use Topics  ? Alcohol use: No  ? Drug use: No  ? ? ? ?Allergies   ?Patient has no known allergies. ? ? ?Review of  Systems ?Review of Systems ?Per HPI ? ?Physical Exam ?Triage Vital Signs ?ED Triage Vitals  ?Enc Vitals Group  ?   BP 06/06/21 1606 130/84  ?   Pulse Rate 06/06/21 1606 70  ?   Resp 06/06/21 1606 18  ?   Temp 06/06/21 1606 98.6 ?F (37 ?C)  ?   Temp Source 06/06/21 1606 Oral  ?   SpO2 06/06/21 1606 97 %  ?   Weight --   ?   Height --   ?   Head Circumference --   ?   Peak Flow --   ?   Pain Score 06/06/21 1604 0  ?   Pain Loc --   ?   Pain Edu? --   ?   Excl. in GC? --   ? ?No data found. ? ?Updated Vital Signs ?BP 130/84 (BP Location: Left Arm)   Pulse 70   Temp 98.6 ?F (37 ?C) (Oral)   Resp 18   SpO2 97%  ? ?Visual Acuity ?Right Eye Distance:   ?Left Eye Distance:   ?Bilateral Distance:   ? ?Right Eye Near:   ?Left Eye Near:    ?Bilateral Near:    ? ?Physical Exam ?Constitutional:   ?   General: He is not in acute distress. ?  Appearance: Normal appearance. He is not toxic-appearing or diaphoretic.  ?HENT:  ?   Head: Normocephalic and atraumatic.  ?Eyes:  ?   Extraocular Movements: Extraocular movements intact.  ?   Conjunctiva/sclera: Conjunctivae normal.  ?Pulmonary:  ?   Effort: Pulmonary effort is normal.  ?Skin: ?   Comments: Mild swelling and mild erythema located to skin surrounding thumb of right now.  Pus pocket noted directly above cuticle of right thumb.  No drainage noted.  Neurovascular intact.  Patient has full range of motion.  ?Neurological:  ?   General: No focal deficit present.  ?   Mental Status: He is alert and oriented to person, place, and time. Mental status is at baseline.  ?Psychiatric:     ?   Mood and Affect: Mood normal.     ?   Behavior: Behavior normal.     ?   Thought Content: Thought content normal.     ?   Judgment: Judgment normal.  ? ? ? ?UC Treatments / Results  ?Labs ?(all labs ordered are listed, but only abnormal results are displayed) ?Labs Reviewed - No data to display ? ?EKG ? ? ?Radiology ?No results found. ? ?Procedures ?Procedures (including critical care  time) ? ?Medications Ordered in UC ?Medications - No data to display ? ?Initial Impression / Assessment and Plan / UC Course  ?I have reviewed the triage vital signs and the nursing notes. ? ?Pertinent labs & imaging results that were available during my care of the patient were reviewed by me and considered in my medical decision making (see chart for details). ? ?  ? ?Physical exam consistent with paronychia.  Pus pocket drained with 18-gauge needle with improvement in swelling and pus.  Cephalexin prescribed for patient.  Patient advised to use warm Epson salt soaks.  Discussed return precautions.  Patient verbalized understanding and was agreeable with plan. ?Final Clinical Impressions(s) / UC Diagnoses  ? ?Final diagnoses:  ?Paronychia of right thumb  ? ? ? ?Discharge Instructions   ? ?  ?You have an infection of your right thumb which is being treated with antibiotics.  Please also use warm Epsom salt soaks.  Follow-up if symptoms persist or worsen. ? ? ? ?ED Prescriptions   ? ? Medication Sig Dispense Auth. Provider  ? cephALEXin (KEFLEX) 500 MG capsule Take 1 capsule (500 mg total) by mouth 4 (four) times daily. 28 capsule Ervin Knack E, [place]  ? ?  ? ?PDMP not reviewed this encounter. ?  ?Gustavus Bryant, [place] ?06/06/21 1632 ? ?

## 2021-06-06 NOTE — ED Triage Notes (Signed)
Pt here for finger infection/abscess onset ~ Sunday to right thumb.  ?

## 2021-10-15 ENCOUNTER — Other Ambulatory Visit: Payer: Self-pay

## 2021-10-15 ENCOUNTER — Emergency Department (HOSPITAL_COMMUNITY)
Admission: EM | Admit: 2021-10-15 | Discharge: 2021-10-15 | Disposition: A | Discharge status: Home / Self Care | Payer: BLUE CROSS/BLUE SHIELD | Attending: Emergency Medicine | Admitting: Emergency Medicine

## 2021-10-15 ENCOUNTER — Emergency Department (HOSPITAL_COMMUNITY): Payer: BLUE CROSS/BLUE SHIELD

## 2021-10-15 ENCOUNTER — Encounter (HOSPITAL_COMMUNITY): Payer: Self-pay

## 2021-10-15 DIAGNOSIS — Y900 Blood alcohol level of less than 20 mg/100 ml: Secondary | ICD-10-CM | POA: Diagnosis not present

## 2021-10-15 DIAGNOSIS — X500XXA Overexertion from strenuous movement or load, initial encounter: Secondary | ICD-10-CM | POA: Insufficient documentation

## 2021-10-15 DIAGNOSIS — S39012A Strain of muscle, fascia and tendon of lower back, initial encounter: Secondary | ICD-10-CM | POA: Diagnosis not present

## 2021-10-15 DIAGNOSIS — S3992XA Unspecified injury of lower back, initial encounter: Secondary | ICD-10-CM | POA: Diagnosis present

## 2021-10-15 MED ORDER — CYCLOBENZAPRINE HCL 10 MG PO TABS: 10.0000 mg | ORAL_TABLET | Freq: Two times a day (BID) | ORAL | 0 refills | Status: AC | PRN

## 2021-10-15 MED ORDER — NAPROXEN 500 MG PO TABS: 500.0000 mg | ORAL_TABLET | Freq: Two times a day (BID) | ORAL | 0 refills | Status: AC

## 2021-10-15 MED ORDER — KETOROLAC TROMETHAMINE 60 MG/2ML IM SOLN
60.0000 mg | Freq: Once | INTRAMUSCULAR | Status: AC
  Administered 2021-10-15: 60 mg via INTRAMUSCULAR
  Filled 2021-10-15: qty 2

## 2021-10-15 NOTE — ED Notes (Signed)
Pt verbalized understanding of d/c instructions, meds, and followup care. Denies questions. VSS, no distress noted. Steady gait to exit with all belongings.  ?

## 2021-10-15 NOTE — ED Provider Triage Note (Signed)
Emergency Medicine Provider Triage Evaluation Note  Alex Richards , a 31 y.o. male  was evaluated in triage.  Pt complains of right-sided low back pain that is now migrated to the middle of the back over the last few days.  Denies urinary or bowel incontinence.  Denies saddle anesthesia or recent fevers.  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  Pain worsened with range of motion or strain.  Review of Systems  Positive:  Negative: See above  Physical Exam  BP 128/74   Pulse (!) 57   Temp 98.8 F (37.1 C) (Oral)   Resp 16   Ht 5\' 10"  (1.778 m)   Wt (!) 144.2 kg   SpO2 96%   BMI 45.63 kg/m  Gen:   Awake, no distress   Resp:  Normal effort  MSK:   Moves extremities without difficulty  Other:  Strength, ROM, and sensation appears grossly intact lower extremities.  No saddle anesthesia.  Mild right lower lumbar tenderness and midline tenderness around L4-L5.  Medical Decision Making  Medically screening exam initiated at 10:44 AM.  Appropriate orders placed.  Alex Richards was informed that the remainder of the evaluation will be completed by another provider, this initial triage assessment does not replace that evaluation, and the importance of remaining in the ED until their evaluation is complete.     Vern Claude, PA-C 10/15/21 1046

## 2021-10-15 NOTE — ED Triage Notes (Signed)
Patient reports lower  right back pain going into center that started Thursday at work. Patient lifts heavy objects at work.  Denies numbness and tingling down right or left leg.

## 2021-10-15 NOTE — Discharge Instructions (Signed)
The x-ray was normal.  Follow-up with orthopedist as needed if you continue to have pain.  You can use the anti-inflammatory and muscle spasm medication as needed.  The muscle spasm medication may cause drowsiness so make sure you do not drive right after taking it.

## 2021-10-15 NOTE — ED Provider Notes (Signed)
[facility] EMERGENCY DEPARTMENT Provider Note   CSN: [id number] Arrival date & time: 10/15/21  7939     History  Chief Complaint  Patient presents with   Back Pain    Alex Richards is a 31 y.o. male.  Patient is a 31 year old male with no significant past medical history who is presenting today with complaints of right-sided low back pain that started on Thursday which is 5 days prior to arrival.  He reports he tried to go to work on Friday and the pain was too severe so he stayed home.  Over the weekend he has been taking Tylenol and resting however the pain is still been present.  It is most notable when he changes positions and bends over.  He reported on Friday morning it took him approximately 30 minutes to put on his pants.  The pain has remained in his right lower back it is not radiate down his leg he has not had any numbness or tingling in his legs or difficulty walking.  He has not noticed any weakness in his extremities.  He denies any urinary retention, bowel incontinence, fever or centralized back pain.  He does not use IV drugs and has been taking Tylenol for the pain.  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  He cannot recall any specific injury but the pain started about an hour and a half after he was at work on Thursday.  He reports he has had some pain in the past but never been this severe and is never lasted this long.  He denies any abdominal pain, chest pain or shortness of breath.  The history is provided by the patient.  Back Pain Location:  Lumbar spine      Home Medications Prior to Admission medications   Medication Sig Start Date End Date Taking? Authorizing Provider  cyclobenzaprine (FLEXERIL) 10 MG tablet Take 1 tablet (10 mg total) by mouth 2 (two) times daily as needed for muscle spasms. 10/15/21  Yes Gwyneth Sprout, MD  naproxen (NAPROSYN) 500 MG tablet Take 1 tablet (500 mg  total) by mouth 2 (two) times daily. 10/15/21  Yes Gwyneth Sprout, MD  acetaminophen (TYLENOL) 500 MG tablet Take 2 tablets (1,000 mg total) by mouth every 8 (eight) hours as needed for moderate pain. 01/04/19   Law, Waylan Boga, PA-C  cephALEXin (KEFLEX) 500 MG capsule Take 1 capsule (500 mg total) by mouth 4 (four) times daily. 06/06/21   Gustavus Bryant, FNP  ibuprofen (ADVIL) 600 MG tablet Take 1 tablet (600 mg total) by mouth every 6 (six) hours as needed. 03/06/20   Merrilee Jansky, MD  ondansetron (ZOFRAN ODT) 8 MG disintegrating tablet Take 0.5-1 tablets (4-8 mg total) by mouth every 8 (eight) hours as needed for nausea or vomiting. 07/17/20   Arthor Captain, PA-C  pantoprazole (PROTONIX) 40 MG tablet Take 1 tablet (40 mg total) by mouth daily. 09/04/20   Cathren Laine, MD      Allergies    Oregano (origanum vulgare) [origanum oil]    Review of Systems   Review of Systems  Musculoskeletal:  Positive for back pain.    Physical Exam Updated Vital Signs BP 128/74   Pulse (!) 57   Temp 98.8 F (37.1 C) (Oral)   Resp 16   Ht 5\' 10"  (1.778 m)   Wt (!) 144.2 kg   SpO2 96%   BMI  45.63 kg/m  Physical Exam Vitals and nursing note reviewed.  Constitutional:      General: He is not in acute distress.    Appearance: Normal appearance.  HENT:     Head: Normocephalic.  Eyes:     Pupils: Pupils are equal, round, and reactive to light.  Cardiovascular:     Rate and Rhythm: Normal rate.     Pulses: Normal pulses.  Pulmonary:     Effort: Pulmonary effort is normal.  Chest:     Chest wall: No tenderness.  Abdominal:     General: Abdomen is flat.     Palpations: Abdomen is soft.     Tenderness: There is no abdominal tenderness.  Musculoskeletal:        General: Tenderness present.       Back:     Comments: Tenderness with palpation in the right paralumbar area.  Pain reproduced with flexion and extension of the back and any twisting movement.  No centralized spinal tenderness.   No notable rashes  Skin:    General: Skin is warm and dry.  Neurological:     Mental Status: He is alert. Mental status is at baseline.     Comments:   5/5 strength in right-sided hip flexion, extension, plantar and dorsi flexion of the foot.  Sensation is intact  Psychiatric:        Mood and Affect: Mood normal.     ED Results / Procedures / Treatments   Labs (all labs ordered are listed, but only abnormal results are displayed) Labs Reviewed - No data to display  EKG None  Radiology DG Lumbar Spine Complete  Result Date: 10/15/2021 CLINICAL DATA:  Low back pain. EXAM: LUMBAR SPINE - COMPLETE 4+ VIEW COMPARISON:  None Available. FINDINGS: There is no evidence of lumbar spine fracture. Alignment is normal. Intervertebral disc spaces are maintained. IMPRESSION: Negative. Electronically Signed   By: Larose Hires D.O.   On: 10/15/2021 11:10    Procedures Procedures    Medications Ordered in ED Medications  ketorolac (TORADOL) injection 60 mg (60 mg Intramuscular Given 10/15/21 1534)    ED Course/ Medical Decision Making/ A&P                           Medical Decision Making Amount and/or Complexity of Data Reviewed Radiology: ordered and independent interpretation performed. Decision-making details documented in ED Course.  Risk Prescription drug management.   Pt with gradual onset of back pain suggestive of bulging disc vs lumbar strain.  No neurovascular compromise and no incontinence.  Pt has no infectious sx, hx of CA  or other red flags concerning for pathologic back pain.  Pt is able to ambulate but is painful with change of position.  Normal strength and reflexes on exam.  Denies trauma.  I have independently visualized and interpreted pt's images today. Lumbar image is neg. Will give pt pain control and to return for developement of above sx.         Final Clinical Impression(s) / ED Diagnoses Final diagnoses:  Strain of lumbar region, initial encounter     Rx / DC Orders ED Discharge Orders          Ordered    cyclobenzaprine (FLEXERIL) 10 MG tablet  2 times daily PRN        10/15/21 1526    naproxen (NAPROSYN) 500 MG tablet  2 times daily        10/15/21  1526              Gwyneth Sprout, MD 10/15/21 1544

## 2023-03-27 IMAGING — DX DG CHEST 2V
2 series · 2 of 2 positions shown · non-contrast
Comparison: None.

CLINICAL DATA: Burning sensation in the chest with nausea for 5
days

EXAM:
CHEST - 2 VIEW

[chest pa]
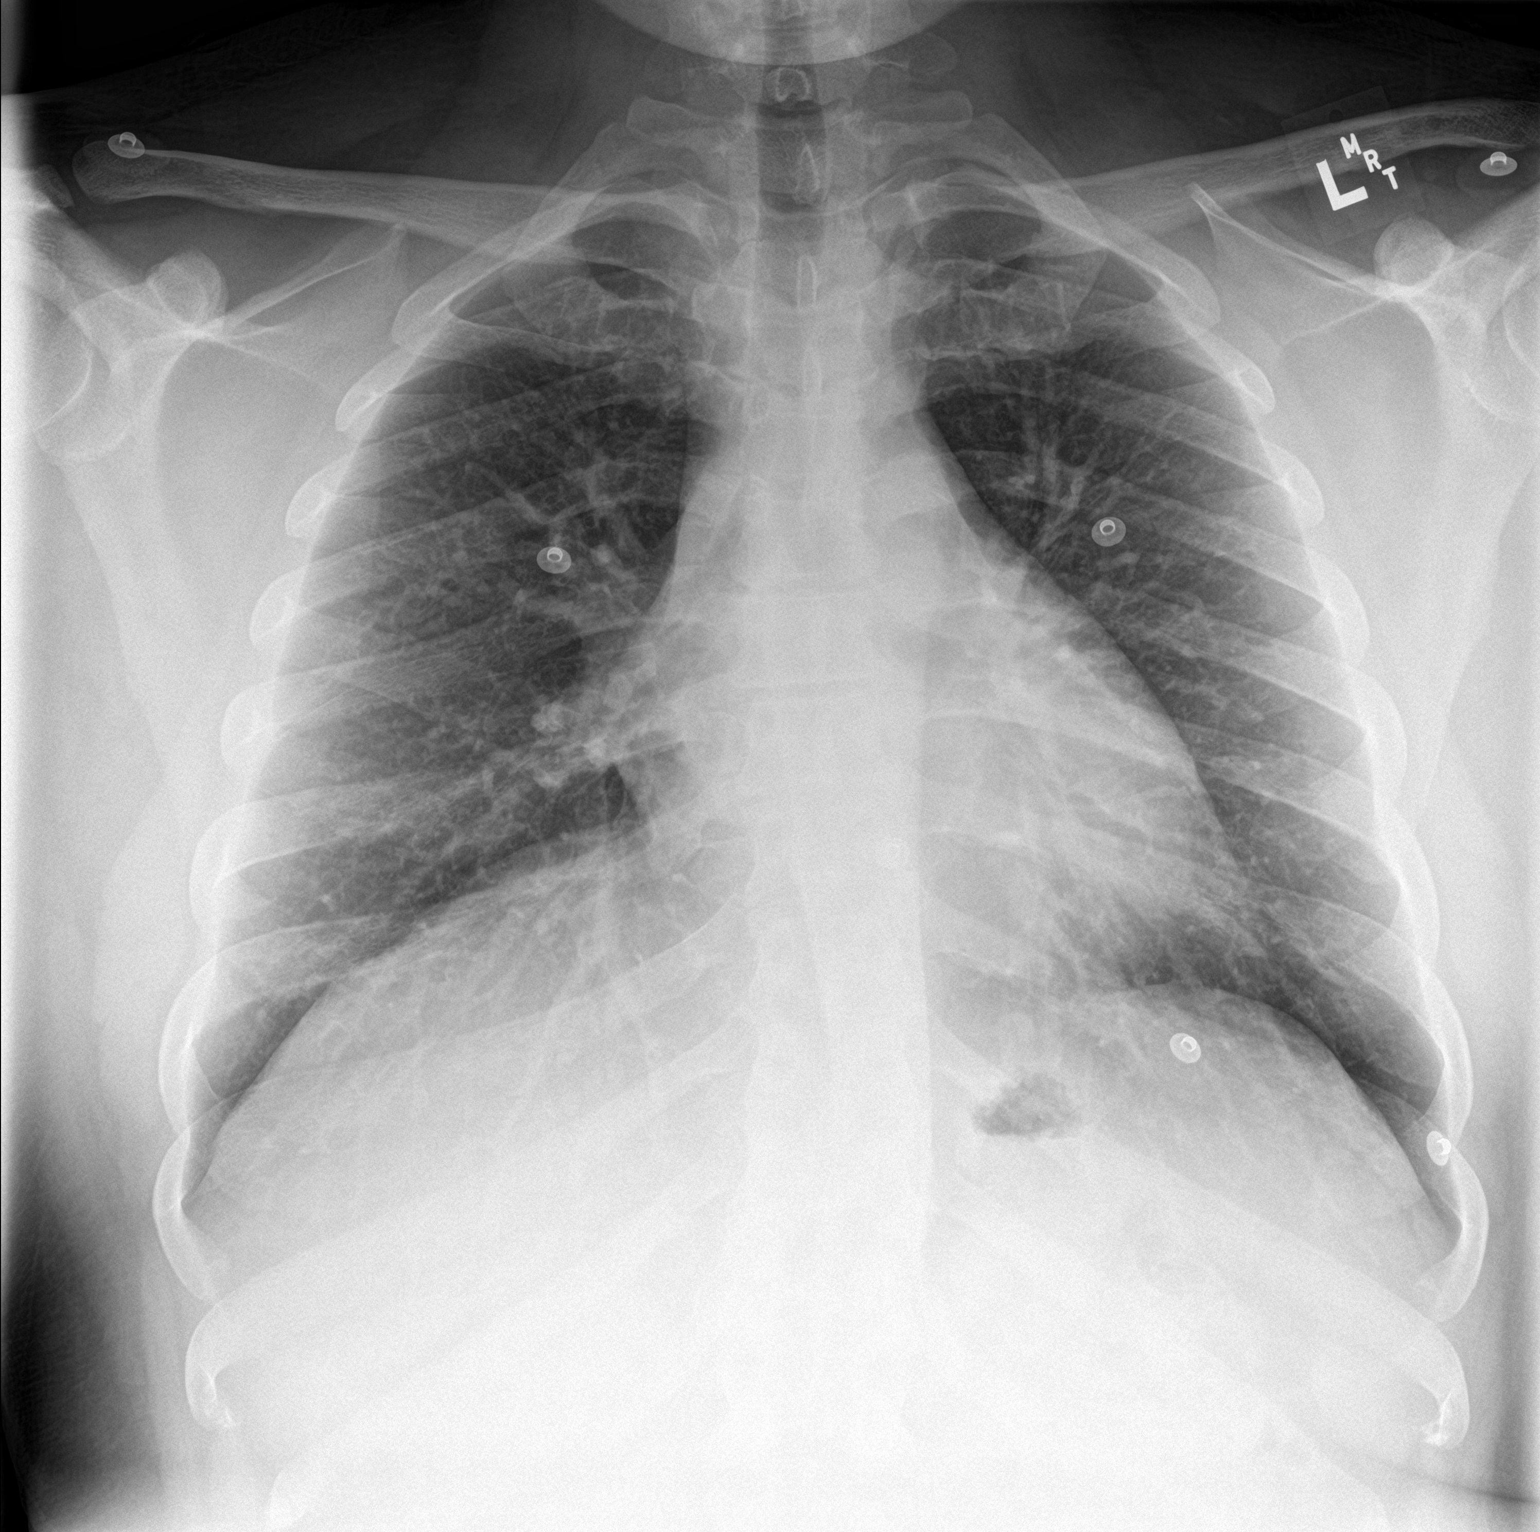

[chest lat]
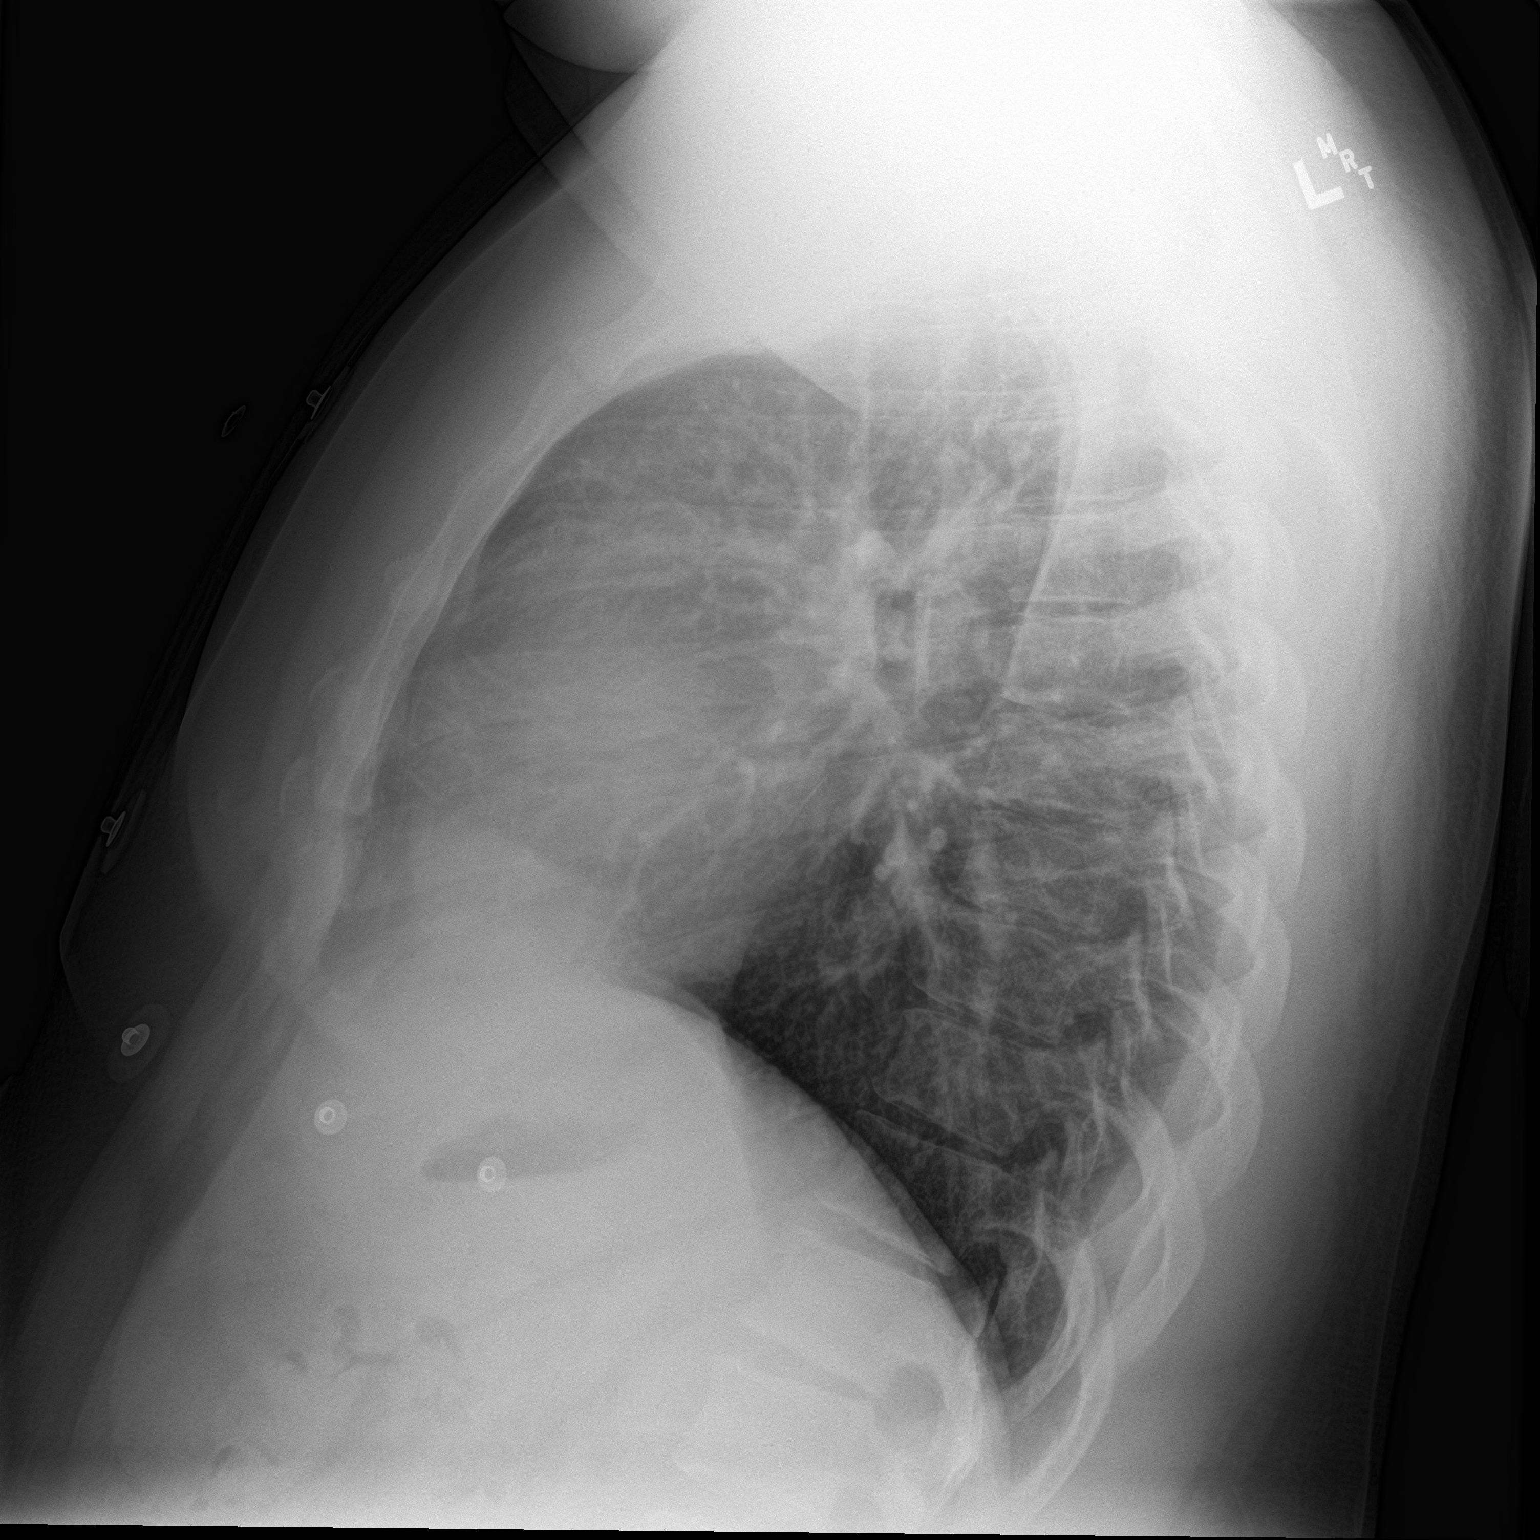

[2 of 2 positions shown; findings below may reference images not displayed]

FINDINGS: The heart size and mediastinal contours are within normal limits.
Both lungs are clear. The visualized skeletal structures are
unremarkable.
IMPRESSION: No active cardiopulmonary disease.
